# Patient Record
Sex: Female | Born: 1970 | Race: White | Hispanic: No | Marital: Married | Smoking: Never smoker
Health system: Southern US, Community
[De-identification: ages and names within clinical notes are randomized; demographics above are authoritative.]

---

## 2002-09-16 ENCOUNTER — Other Ambulatory Visit: Admission: RE | Admit: 2002-09-16 | Discharge: 2002-09-16 | Payer: Self-pay | Admitting: Obstetrics and Gynecology

## 2003-01-28 ENCOUNTER — Other Ambulatory Visit: Admission: RE | Admit: 2003-01-28 | Discharge: 2003-01-28 | Payer: Self-pay | Admitting: Gynecology

## 2007-03-07 ENCOUNTER — Emergency Department: Payer: Self-pay | Admitting: Emergency Medicine

## 2007-07-19 ENCOUNTER — Ambulatory Visit: Payer: Self-pay | Admitting: Internal Medicine

## 2007-07-27 ENCOUNTER — Emergency Department: Payer: Self-pay | Admitting: Emergency Medicine

## 2010-09-15 ENCOUNTER — Ambulatory Visit: Payer: Self-pay | Admitting: Internal Medicine

## 2011-12-18 IMAGING — CT CT ABD-PELV W/ CM
1 of 2 series · 14 of 32 positions shown, 18 images · non-contrast
Comparison: none

REASON FOR EXAM: ADD ON  CR  [id number]  LLQ pain  eval diverticulitis
elevated white blood count
COMMENTS:

[Series 2: abdomen · axial · 0.98mm/px · z∈[-538,-108]mm · 14 of 96 slices shown, 18 images]
[im 5/96  soft-tissue]
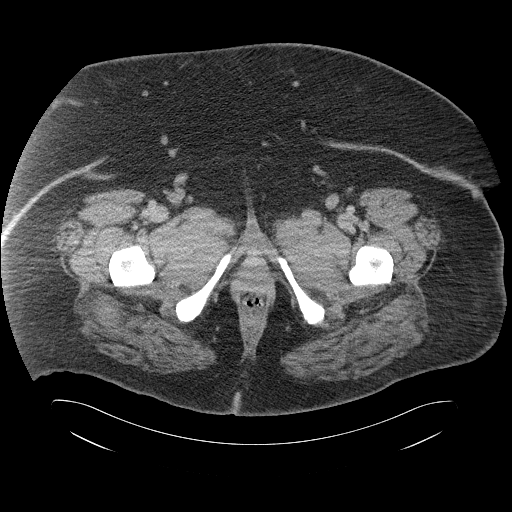
[im 5/96  bone]
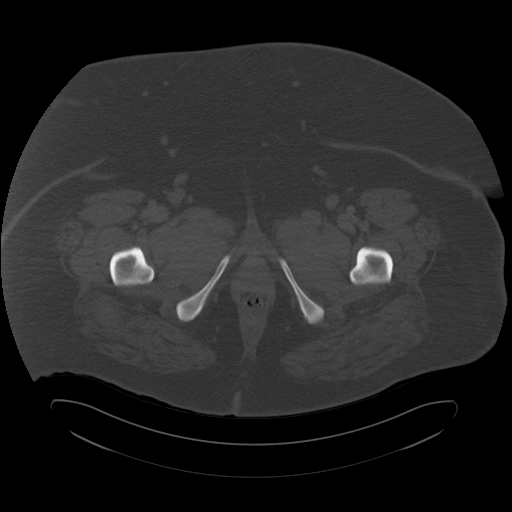
[im 13/96  soft-tissue]
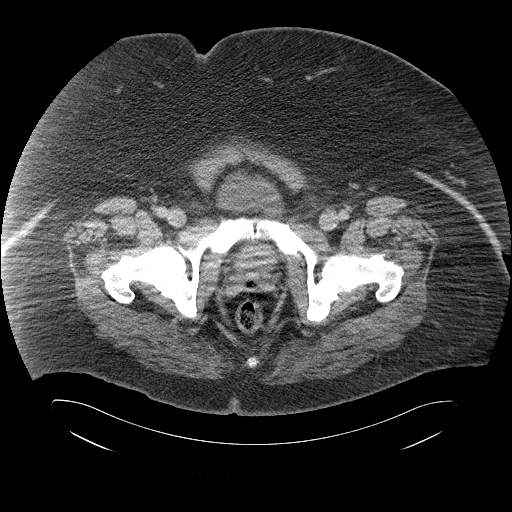
[im 21/96  soft-tissue]
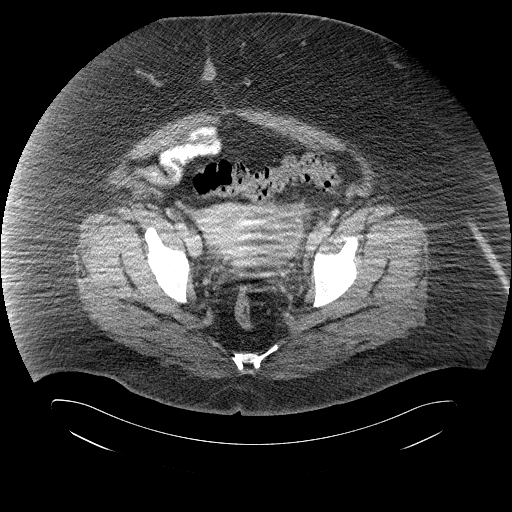
[im 29/96  soft-tissue]
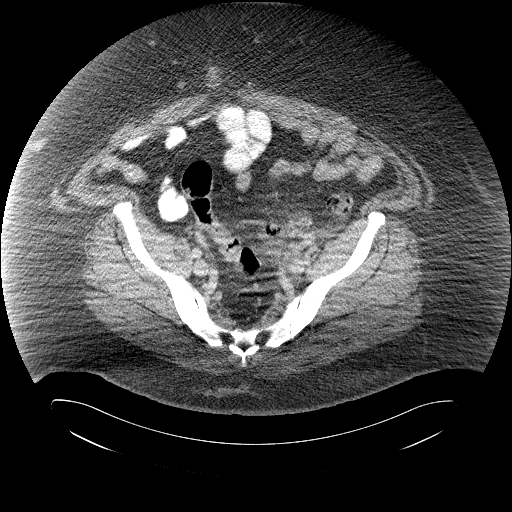
[im 38/96  soft-tissue]
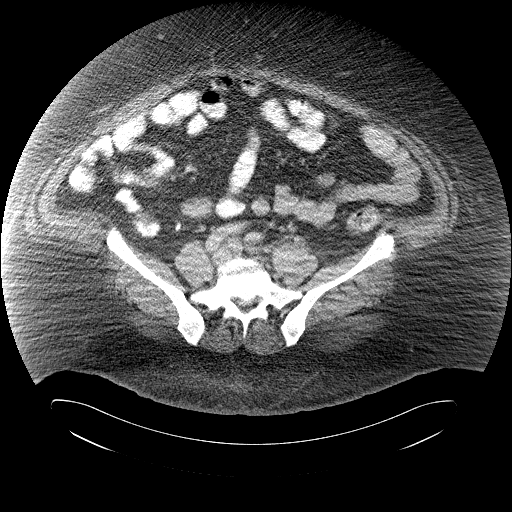
[im 46/96  soft-tissue]
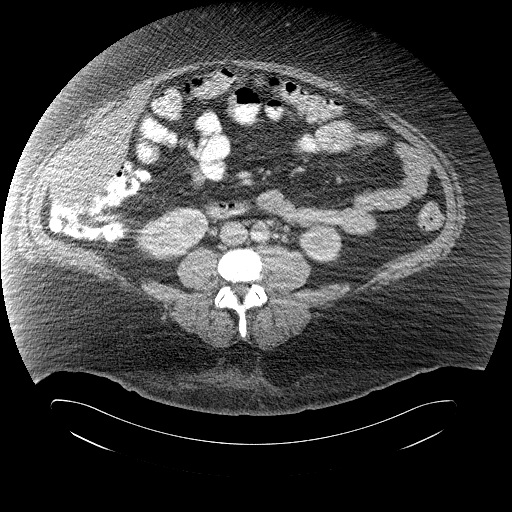
[im 50/96  soft-tissue]
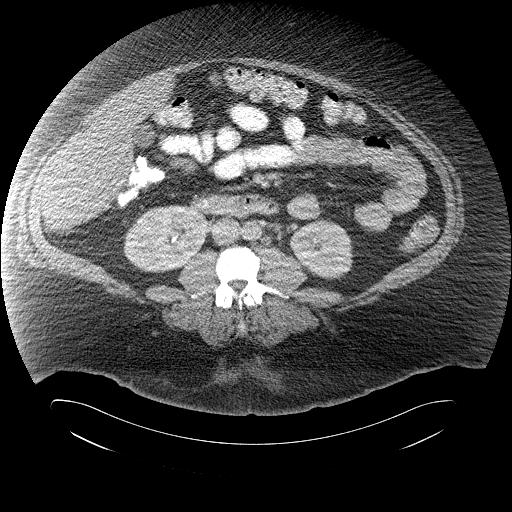
[im 58/96  soft-tissue]
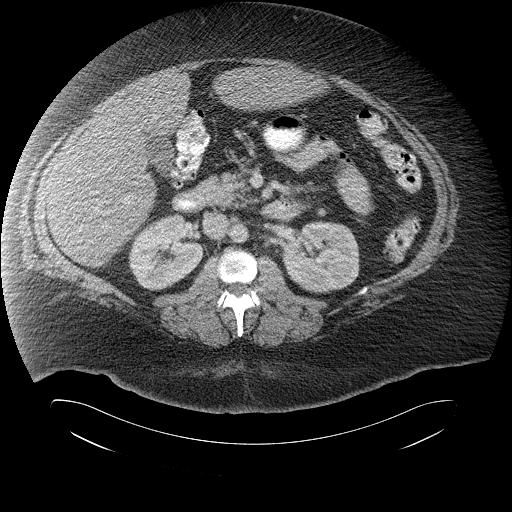
[im 67/96  soft-tissue]
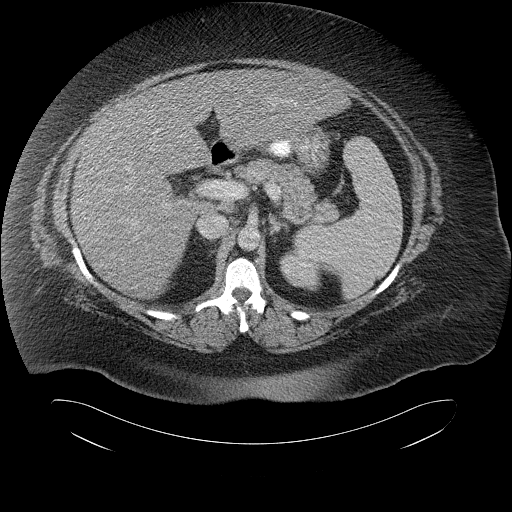
[im 67/96  bone]
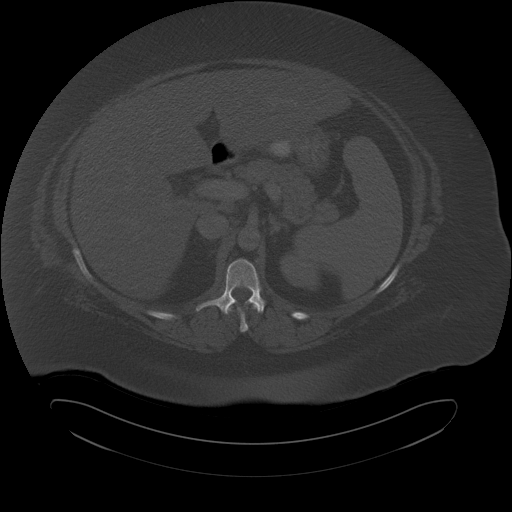
[im 75/96  soft-tissue]
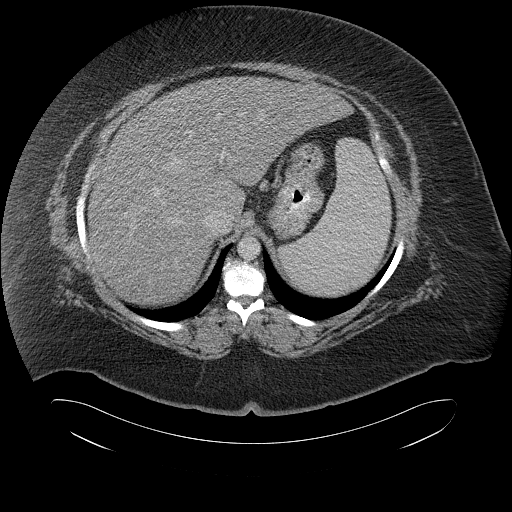
[im 79/96  lung]
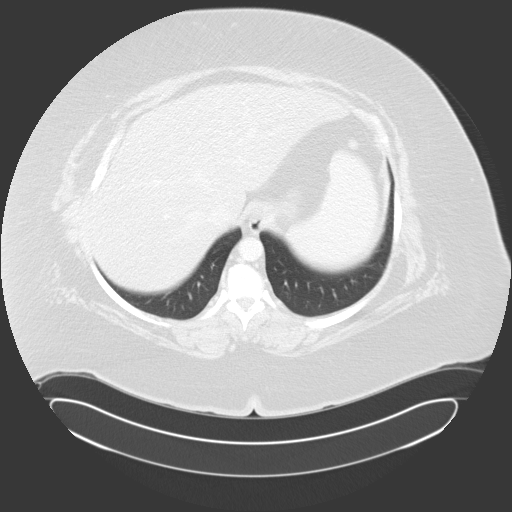
[im 83/96  soft-tissue]
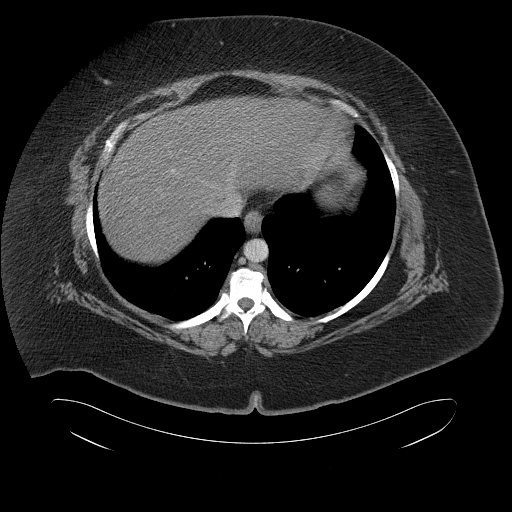
[im 83/96  lung]
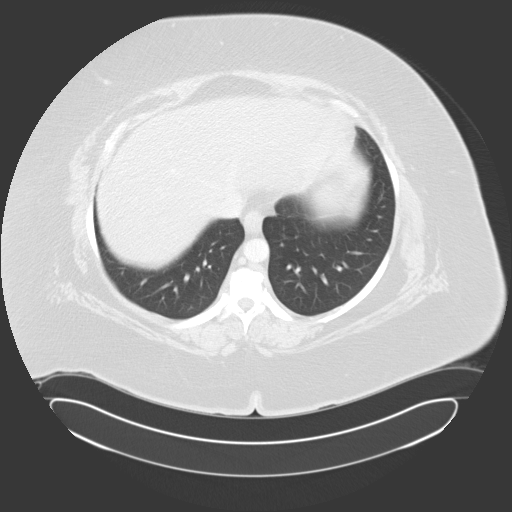
[im 87/96  lung]
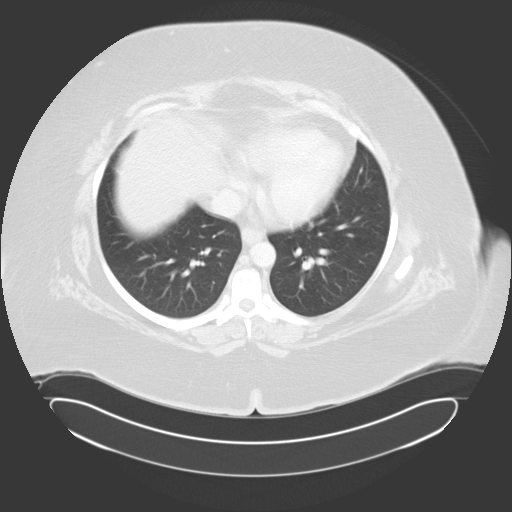
[im 91/96  soft-tissue]
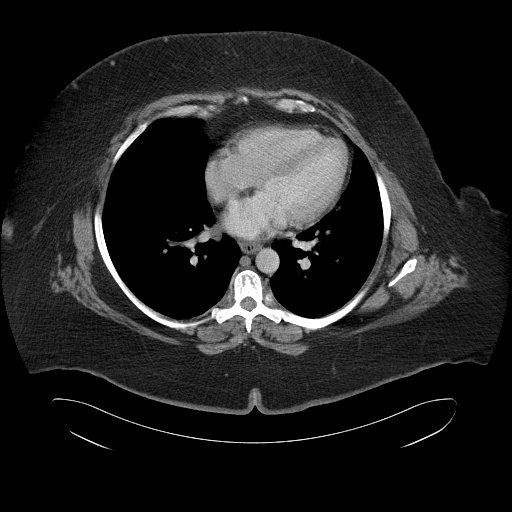
[im 91/96  lung]
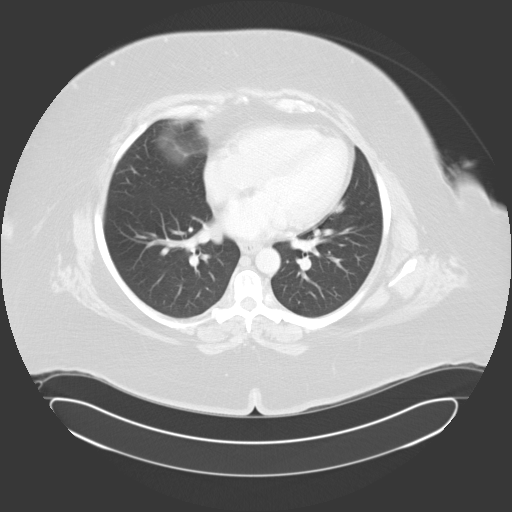

[14 of 32 positions shown; findings below may reference images not displayed]

PROCEDURE:     CT  - CT ABDOMEN / PELVIS  W  - September 15, 2010  [DATE]

RESULT:     Axial CT scanning was performed through the abdomen and pelvis
following intravenous administration of 100 cc of 0sovue-IGI. The patient
also received oral contrast material. Review of multiplanar reconstructed
images was performed separately on the VIA monitor.

The liver exhibits normal density with no focal mass or ductal dilation. The
gallbladder is adequately distended with no evidence of calcified stones.
The spleen, nondistended stomach, and pancreas exhibit no acute abnormality.
The images are degraded as one moves inferiorly from the mid abdomen due to
contact of the patient's patient's flanks with the scanner gantry. The
kidneys exhibit no evidence of obstruction. There is a nonobstructing
approximately 4 mm diameter stone in the lower pole of the right kidney. The
adrenal glands exhibit no parenchymal correction exhibit no masses. The
caliber of the abdominal aorta is normal.

The partially contrast-filled loops of small and large bowel exhibit no
evidence of obstruction. There is sigmoid diverticulosis. There is
incomplete distention of the sigmoid colon and I cannot exclude
diverticulitis. There is a structure which may reflect a diverticular
abscess which lies to the left of the uterus on image 74. This measures 7 x
5.5 cm. I do not see free extraluminal gas collections nor definite free
pelvic fluid. The urinary bladder and uterus are grossly normal within the
limitation of the study. The lung bases are clear. The lumbar vertebral
bodies are preserved in height.
IMPRESSION: 1. The findings are consistent with the clinically suspected sigmoid
diverticulitis. I cannot exclude a diverticular abscess to the left of the
uterus but this area is difficult to evaluate due to artifact from the
contact of the patient's flanks with the CT gantry.
2. I see no acute hepatobiliary abnormality.
3. There is a nonobstructing stone in the lower pole of the right kidney.

This report was called to Dr. Locklear at the conclusion of the study.

## 2021-11-11 ENCOUNTER — Encounter: Payer: Self-pay | Admitting: Emergency Medicine

## 2021-11-11 ENCOUNTER — Ambulatory Visit
Admission: EM | Admit: 2021-11-11 | Discharge: 2021-11-11 | Disposition: A | Discharge status: Home / Self Care | Payer: Self-pay

## 2021-11-11 ENCOUNTER — Other Ambulatory Visit: Payer: Self-pay

## 2021-11-11 DIAGNOSIS — I10 Essential (primary) hypertension: Secondary | ICD-10-CM

## 2021-11-11 DIAGNOSIS — E119 Type 2 diabetes mellitus without complications: Secondary | ICD-10-CM

## 2021-11-11 DIAGNOSIS — G629 Polyneuropathy, unspecified: Secondary | ICD-10-CM

## 2021-11-11 DIAGNOSIS — L03116 Cellulitis of left lower limb: Secondary | ICD-10-CM | POA: Insufficient documentation

## 2021-11-11 DIAGNOSIS — E282 Polycystic ovarian syndrome: Secondary | ICD-10-CM

## 2021-11-11 LAB — CBC WITH DIFFERENTIAL/PLATELET
Abs Immature Granulocytes: 0.16 10*3/uL — ABNORMAL HIGH (ref 0.00–0.07)
Basophils Absolute: 0.1 10*3/uL (ref 0.0–0.1)
Basophils Relative: 0 %
Eosinophils Absolute: 0.1 10*3/uL (ref 0.0–0.5)
Eosinophils Relative: 1 %
HCT: 43.5 % (ref 36.0–46.0)
Hemoglobin: 14.6 g/dL (ref 12.0–15.0)
Immature Granulocytes: 1 %
Lymphocytes Relative: 17 %
Lymphs Abs: 3.3 10*3/uL (ref 0.7–4.0)
MCH: 29.3 pg (ref 26.0–34.0)
MCHC: 33.6 g/dL (ref 30.0–36.0)
MCV: 87.2 fL (ref 80.0–100.0)
Monocytes Absolute: 1.7 10*3/uL — ABNORMAL HIGH (ref 0.1–1.0)
Monocytes Relative: 8 %
Neutro Abs: 14.5 10*3/uL — ABNORMAL HIGH (ref 1.7–7.7)
Neutrophils Relative %: 73 %
Platelets: 163 10*3/uL (ref 150–400)
RBC: 4.99 MIL/uL (ref 3.87–5.11)
RDW: 13.2 % (ref 11.5–15.5)
WBC: 19.9 10*3/uL — ABNORMAL HIGH (ref 4.0–10.5)
nRBC: 0 % (ref 0.0–0.2)

## 2021-11-11 MED ORDER — CEFTRIAXONE SODIUM 1 G IJ SOLR
1.0000 g | Freq: Once | INTRAMUSCULAR | Status: AC
  Administered 2021-11-11: 10:00:00 1 g via INTRAMUSCULAR

## 2021-11-11 MED ORDER — DOXYCYCLINE HYCLATE 100 MG PO CAPS: 100.0000 mg | ORAL_CAPSULE | Freq: Two times a day (BID) | ORAL | 0 refills | Status: DC

## 2021-11-11 NOTE — ED Provider Notes (Signed)
[facility]    CSN: [id number] Arrival date & time: 11/11/21  0805      History   Chief Complaint Chief Complaint  Patient presents with   Fever   Rash    Left knee    HPI April Fisher is a 50 y.o. female who presents with hx of having  a fever up to 103 since 4 days ago. This am at 2 am woke up with chills and her temp was 102. Has mild rhinitis today, but denies cough, HA or body aches. Yesterday noticed mild redness below her knee, and this am is worse. Has hx of cellulitis on her legs x 2 in the past. She scraped the back of her L leg this past weekend. Denies pain on the redness on the anterior lower leg, but where she scraped herself is a little tender. Denies hx of MRSA or staph infections.    Past Medical History:  Diagnosis Date   Diabetes (HCC)    Hypertension    Neuropathy    PCOS (polycystic ovarian syndrome)     Patient Active Problem List   Diagnosis Date Noted   Diabetes (HCC) 11/11/2021   Hypertension 11/11/2021   Neuropathy 11/11/2021   PCOS (polycystic ovarian syndrome) 11/11/2021    Past Surgical History:  Procedure Laterality Date   HERNIA REPAIR      OB History   No obstetric history on file.      Home Medications    Prior to Admission medications   Medication Sig Start Date End Date Taking? Authorizing Provider  doxycycline (VIBRAMYCIN) 100 MG capsule Take 1 capsule (100 mg total) by mouth 2 (two) times daily. 11/11/21  Yes Rodriguez-Southworth, Nettie Elm, PA-C  gabapentin (NEURONTIN) 300 MG capsule Take by mouth. 01/02/20  Yes [provider]  insulin regular human CONCENTRATED (HUMULIN R) 500 UNIT/ML injection Inject 30 unit markings using a U-100 insulin syringe AC breakfast and supper 01/02/20  Yes [provider]  metFORMIN (GLUCOPHAGE-XR) 500 MG 24 hr tablet Take by mouth. 01/02/20  Yes [provider]  BENICAR 40 MG tablet Take 40 mg by mouth daily. 10/04/21   [provider]  OZEMPIC,  0.25 OR 0.5 MG/DOSE, 2 MG/1.5ML SOPN Inject into the skin. 11/02/21   [provider]    Family History History reviewed. No pertinent family history.  Social History Social History   Tobacco Use   Smoking status: Never   Smokeless tobacco: Never  Vaping Use   Vaping Use: Never used  Substance Use Topics   Alcohol use: Yes    Comment: occ   Drug use: Not Currently     Allergies   Sulfa antibiotics   Review of Systems Review of Systems  Constitutional:  Positive for chills and fever.  HENT:  Positive for rhinorrhea. Negative for congestion, ear discharge, ear pain, sore throat and trouble swallowing.   Respiratory:  Negative for cough, chest tightness and shortness of breath.   Musculoskeletal:  Negative for myalgias.  Skin:  Positive for color change, rash and wound.  Neurological:  Negative for headaches.  Hematological:  Negative for adenopathy.    Physical Exam Triage Vital Signs ED Triage Vitals  Enc Vitals Group     BP 11/11/21 0831 113/79     Pulse Rate 11/11/21 0831 (!) 104     Resp 11/11/21 0831 20     Temp 11/11/21 0831 99.7 F (37.6 C)     Temp Source 11/11/21 0831 Oral  SpO2 11/11/21 0831 95 %     Weight 11/11/21 0826 (!) 375 lb (170.1 kg)     Height 11/11/21 0826 5\' 4"  (1.626 m)     Head Circumference --      Peak Flow --      Pain Score 11/11/21 0826 0     Pain Loc --      Pain Edu? --      Excl. in [place]? --    No data found.  Updated Vital Signs BP 113/79 (BP Location: Left Leg)   Pulse (!) 104   Temp 99.7 F (37.6 C) (Oral)   Resp 20   Ht 5\' 4"  (1.626 m)   Wt (!) 375 lb (170.1 kg)   LMP  (LMP Unknown)   SpO2 95%   BMI 64.37 kg/m   Visual Acuity Right Eye Distance:   Left Eye Distance:   Bilateral Distance:    Right Eye Near:   Left Eye Near:    Bilateral Near:     Physical Exam Vitals and nursing note reviewed.  Constitutional:      General: She is not in acute distress.    Appearance: She is obese. She is not  toxic-appearing.  HENT:     Head: Normocephalic.     Right Ear: Tympanic membrane, ear canal and external ear normal.     Left Ear: Tympanic membrane, ear canal and external ear normal.     Nose: Nose normal.     Mouth/Throat:     Mouth: Mucous membranes are moist.     Pharynx: Oropharynx is clear.  Eyes:     General: No scleral icterus.    Conjunctiva/sclera: Conjunctivae normal.  Cardiovascular:     Rate and Rhythm: Normal rate and regular rhythm.     Heart sounds: No murmur heard. Pulmonary:     Effort: Pulmonary effort is normal.     Breath sounds: Normal breath sounds.  Musculoskeletal:        General: Normal range of motion.     Cervical back: Neck supple. No rigidity.     Comments: L LOWER LEG- with 2 cm x 3 cm scab on the back on her lower leg,above the achillis tendon and surrounding erythema and warmth which extends to anterior lower leg and below knee. Feel does not show any open wounds or erythema. There are no streaking going to her inner leg or groin.   Lymphadenopathy:     Cervical: No cervical adenopathy.  Skin:    General: Skin is warm and dry.     Findings: Erythema present. No bruising.  Neurological:     Mental Status: She is alert and oriented to person, place, and time.     Gait: Gait normal.  Psychiatric:        Mood and Affect: Mood normal.        Behavior: Behavior normal.        Thought Content: Thought content normal.        Judgment: Judgment normal.     UC Treatments / Results  Labs (all labs ordered are listed, but only abnormal results are displayed) Labs Reviewed  CBC WITH DIFFERENTIAL/PLATELET - Abnormal; Notable for the following components:      Result Value   WBC 19.9 (*)    Neutro Abs 14.5 (*)    Monocytes Absolute 1.7 (*)    Abs Immature Granulocytes 0.16 (*)    All other components within normal limits    EKG  Radiology No results found.  Procedures Procedures (including critical care time)  Medications Ordered in  UC Medications  cefTRIAXone (ROCEPHIN) injection 1 g (1 g Intramuscular Given 11/11/21 1021)    Initial Impression / Assessment and Plan / UC Course  I have reviewed the triage vital signs and the nursing notes. Pertinent labs results that were available during my care of the patient were reviewed by me and considered in my medical decision making (see chart for details). Cellulitis L lower leg with elevated white cell count on CBC I consulted this case with Dr Leonides Grills and he agreed trial of treatment as out patient. She was given Rocephin 1 G IM and I sent Doxy to her pharmacy as noted. I marked the red areas and explained that if the redness goes past the markings in the next 24 hours, she needs to go to ER for IV antibiotics. She agreed.   Final Clinical Impressions(s) / UC Diagnoses   Final diagnoses:  Cellulitis of left anterior lower leg     Discharge Instructions      Go to the ER if the redness extends beyond the markings. Start the oral antibiotic today.      ED Prescriptions     Medication Sig Dispense Auth. Provider   doxycycline (VIBRAMYCIN) 100 MG capsule Take 1 capsule (100 mg total) by mouth 2 (two) times daily. 20 capsule Rodriguez-Southworth, Nettie Elm, PA-C      PDMP not reviewed this encounter.   Garey Ham, PA-C 11/11/21 1029

## 2021-11-11 NOTE — ED Triage Notes (Signed)
Pt presents today with redness/rash to left knee with fever that began on Monday. Denies pain to left knee.

## 2021-11-11 NOTE — Discharge Instructions (Signed)
Go to the ER if the redness extends beyond the markings. Start the oral antibiotic today.

## 2023-03-14 ENCOUNTER — Ambulatory Visit
Admission: EM | Admit: 2023-03-14 | Discharge: 2023-03-14 | Disposition: A | Discharge status: Home / Self Care | Payer: Self-pay

## 2023-03-14 DIAGNOSIS — L03116 Cellulitis of left lower limb: Secondary | ICD-10-CM

## 2023-03-14 MED ORDER — DOXYCYCLINE HYCLATE 100 MG PO CAPS: 100.0000 mg | ORAL_CAPSULE | Freq: Two times a day (BID) | ORAL | 0 refills | Status: DC

## 2023-03-14 MED ORDER — CEFTRIAXONE SODIUM 1 G IJ SOLR
1.0000 g | Freq: Once | INTRAMUSCULAR | Status: AC
  Administered 2023-03-14: 1 g via INTRAMUSCULAR

## 2023-03-14 NOTE — Discharge Instructions (Signed)
We are covering you for infection in your lower leg.  Please take doxycycline 100 mg twice daily for 10 days.  Stay out of the sun while on this medication as it can cause you to have a sunburn.  We gave you an injection of ceftriaxone to help with your symptoms today.  Keep your leg elevated and ensure you are washing it with soap and water.  If you have any changing or concerning symptoms for a blood clot you need to go to the emergency room including increasing swelling, redness, pain or associated chest pain, shortness of breath, heart racing you need to be seen immediately.  If your symptoms are proving quickly or if you develop any fever, nausea, vomiting you should be seen again.  Monitor the area of redness and if this spreads rapidly you should be reevaluated.  Follow-up with your primary care next week.

## 2023-03-14 NOTE — ED Provider Notes (Signed)
[facility]    CSN: [id number] Arrival date & time: 03/14/23  1721      History   Chief Complaint No chief complaint on file.   HPI April Fisher is a 52 y.o. female.   Patient presents today with a 1 day history of spreading erythema in her left lower leg.  She reports she has had chills but has not had fever that she has been taking NSAIDs on a regular basis due to back pain related to recurrent kidney stones.  She is unsure if this could be keeping her fever down unintentionally.  She denies any nausea, vomiting, chest pain, shortness of breath, heart racing.  She does have a history of recurrent cellulitis in her lower extremities with similar presentation.  She was last treated approximately 1.5 years ago with doxycycline.  She denies any recent antibiotics in the past 90 days but reports that she generally does not do well with penicillin-based medications as they are not effective.  She denies diagnosis of MRSA.  She does have a history of diabetes but reports her blood sugars are adequately controlled.  She denies any risk factors for VTE event including recent hospitalization, immobilization, COVID, exogenous hormone use, active malignancy.  She is anxious to improve as she is scheduled to go out of town later this week.    Past Medical History:  Diagnosis Date   Diabetes ([place])    Hypertension    Neuropathy    PCOS (polycystic ovarian syndrome)     Patient Active Problem List   Diagnosis Date Noted   Diabetes ([place]) 11/11/2021   Hypertension 11/11/2021   Neuropathy 11/11/2021   PCOS (polycystic ovarian syndrome) 11/11/2021    Past Surgical History:  Procedure Laterality Date   HERNIA REPAIR      OB History   No obstetric history on file.      Home Medications    Prior to Admission medications   Medication Sig Start Date End Date Taking? Authorizing Provider  BENICAR 40 MG tablet Take 40 mg by mouth daily. 10/04/21  Yes [provider]   gabapentin (NEURONTIN) 300 MG capsule Take by mouth. 01/02/20  Yes [provider]  insulin regular human CONCENTRATED (HUMULIN R) 500 UNIT/ML injection Inject 30 unit markings using a U-100 insulin syringe AC breakfast and supper 01/02/20  Yes [provider]  metFORMIN (GLUCOPHAGE-XR) 500 MG 24 hr tablet Take by mouth. 01/02/20  Yes [provider]  VICTOZA 18 MG/3ML SOPN Inject into the skin. 02/20/23  Yes [provider]  doxycycline (VIBRAMYCIN) 100 MG capsule Take 1 capsule (100 mg total) by mouth 2 (two) times daily. 03/14/23   Dillon Mcreynolds, Derry Skill, PA-C    Family History History reviewed. No pertinent family history.  Social History Social History   Tobacco Use   Smoking status: Never   Smokeless tobacco: Never  Vaping Use   Vaping Use: Never used  Substance Use Topics   Alcohol use: Yes    Comment: occ   Drug use: Not Currently     Allergies   Sulfa antibiotics   Review of Systems Review of Systems  Constitutional:  Positive for activity change and chills. Negative for appetite change, fatigue and fever.  Respiratory:  Negative for cough and shortness of breath.   Cardiovascular:  Positive for leg swelling. Negative for chest pain and palpitations.  Gastrointestinal:  Negative for abdominal pain, diarrhea, nausea and vomiting.  Musculoskeletal:  Negative for arthralgias and myalgias.  Skin:  Positive for color change and rash. Negative for wound.  Neurological:  Negative for dizziness, light-headedness and headaches.     Physical Exam Triage Vital Signs ED Triage Vitals  Enc Vitals Group     BP 03/14/23 1826 (S) (!) 149/100     Pulse Rate 03/14/23 1826 (!) 106     Resp --      Temp 03/14/23 1826 98.4 F (36.9 C)     Temp Source 03/14/23 1826 Oral     SpO2 03/14/23 1826 97 %     Weight --      Height --      Head Circumference --      Peak Flow --      Pain Score 03/14/23 1824 0     Pain Loc --      Pain Edu? --      Excl. in  [place]? --    No data found.  Updated Vital Signs BP (S) (!) 149/100 (BP Location: Right Wrist)   Pulse (!) 106   Temp 98.4 F (36.9 C) (Oral)   SpO2 97%   Visual Acuity Right Eye Distance:   Left Eye Distance:   Bilateral Distance:    Right Eye Near:   Left Eye Near:    Bilateral Near:     Physical Exam Vitals reviewed.  Constitutional:      General: She is awake. She is not in acute distress.    Appearance: Normal appearance. She is well-developed. She is not ill-appearing.     Comments: Very pleasant female appears stated age in no acute distress sitting comfortably in exam room  HENT:     Head: Normocephalic and atraumatic.  Cardiovascular:     Rate and Rhythm: Normal rate and regular rhythm.     Heart sounds: Normal heart sounds, S1 normal and S2 normal. No murmur heard.    Comments: Negative Bevelyn Buckles' sign on left Pulmonary:     Effort: Pulmonary effort is normal.     Breath sounds: Normal breath sounds. No wheezing, rhonchi or rales.     Comments: Clear to auscultation bilaterally Abdominal:     General: Bowel sounds are normal.     Palpations: Abdomen is soft.     Tenderness: There is no abdominal tenderness.  Musculoskeletal:     Left lower leg: No tenderness or bony tenderness.  Skin:    Findings: Erythema present.          Comments: Erythema noted left lower lateral leg without obvious wound.  No streaking or evidence of lymphangitis.  No bleeding or drainage noted.  Psychiatric:        Behavior: Behavior is cooperative.      UC Treatments / Results  Labs (all labs ordered are listed, but only abnormal results are displayed) Labs Reviewed - No data to display  EKG   Radiology No results found.  Procedures Procedures (including critical care time)  Medications Ordered in UC Medications  cefTRIAXone (ROCEPHIN) injection 1 g (has no administration in time range)    Initial Impression / Assessment and Plan / UC Course  I have reviewed the  triage vital signs and the nursing notes.  Pertinent labs & imaging results that were available during my care of the patient were reviewed by me and considered in my medical decision making (see chart for details).     Patient was mildly tachycardic but otherwise well-appearing and afebrile.  Review of chart indicates that she is generally mildly tachycardic and this  is her baseline.  We did discuss that given her tachycardia and leg swelling it is appropriate to investigate for DVT, however, patient is confident that her symptoms are related to cellulitis that she has had multiple episodes with similar presentation in this leg before.  Ultrasound was deferred for the time being but we discussed that if her symptoms not improving with antibiotics or if she has any worsening symptoms she needs to return immediately to which she expressed understanding.  Will cover with Rocephin 1 g in clinic and doxycycline 100 mg twice daily for 10 days.  Discussed that doxycycline can make her sensitive to the sun and she should avoid prolonged sun exposure while on this medication.  She is to keep area clean with soap and water and monitor area of erythema.  If area of erythema spreads after being on antibiotics she is to return for reevaluation.  Discussed that if she has any worsening symptoms including nausea, vomiting, fever she should be seen immediately.  Strict return precautions given.  Final Clinical Impressions(s) / UC Diagnoses   Final diagnoses:  Cellulitis of leg, left     Discharge Instructions      We are covering you for infection in your lower leg.  Please take doxycycline 100 mg twice daily for 10 days.  Stay out of the sun while on this medication as it can cause you to have a sunburn.  We gave you an injection of ceftriaxone to help with your symptoms today.  Keep your leg elevated and ensure you are washing it with soap and water.  If you have any changing or concerning symptoms for a blood  clot you need to go to the emergency room including increasing swelling, redness, pain or associated chest pain, shortness of breath, heart racing you need to be seen immediately.  If your symptoms are proving quickly or if you develop any fever, nausea, vomiting you should be seen again.  Monitor the area of redness and if this spreads rapidly you should be reevaluated.  Follow-up with your primary care next week.     ED Prescriptions     Medication Sig Dispense Auth. Provider   doxycycline (VIBRAMYCIN) 100 MG capsule Take 1 capsule (100 mg total) by mouth 2 (two) times daily. 20 capsule Navjot Pilgrim, Derry Skill, PA-C      PDMP not reviewed this encounter.   Terrilee Croak, PA-C 03/14/23 1844

## 2023-03-14 NOTE — ED Triage Notes (Signed)
Pt here for swelling LT lower extremity, redness. Pt states she have some chills last night, pt has been taking ibuprofen for some back pain so unusre if she's been running a fever. Pt reports she has had cellulitis in the same leg before.

## 2024-01-07 ENCOUNTER — Ambulatory Visit
Admission: RE | Admit: 2024-01-07 | Discharge: 2024-01-07 | Disposition: A | Discharge status: Home / Self Care | Payer: Self-pay | Source: Ambulatory Visit | Attending: Family Medicine | Admitting: Family Medicine

## 2024-01-07 VITALS — BP 159/86 | HR 107 | Temp 98.0°F | Resp 15 | Ht 64.0 in | Wt 375.0 lb

## 2024-01-07 DIAGNOSIS — L03116 Cellulitis of left lower limb: Secondary | ICD-10-CM

## 2024-01-07 MED ORDER — DOXYCYCLINE HYCLATE 100 MG PO CAPS: 100.0000 mg | ORAL_CAPSULE | Freq: Two times a day (BID) | ORAL | 0 refills | Status: DC

## 2024-01-07 MED ORDER — FLUCONAZOLE 150 MG PO TABS
150.0000 mg | ORAL_TABLET | Freq: Once | ORAL | 0 refills | Status: AC
Start: 2024-01-07 — End: 2024-01-07

## 2024-01-07 NOTE — ED Provider Notes (Signed)
 [facility]    CSN: [id number] Arrival date & time: 01/07/24  0843      History   Chief Complaint Chief Complaint  Patient presents with   Cellulitis    Appointment    HPI April Fisher is a 53 y.o. female.   HPI  April Fisher presents for fever, left lower leg redness with mild pain that started yesterday.  Denies any injury or trauma.  Reports having frequent bouts of cellulitis.  At one time she was admitted to the hospital for a week.  Tmax 101 F.   Past Medical History:  Diagnosis Date   Diabetes (HCC)    Hypertension    Neuropathy    PCOS (polycystic ovarian syndrome)     Patient Active Problem List   Diagnosis Date Noted   Diabetes (HCC) 11/11/2021   Hypertension 11/11/2021   Neuropathy 11/11/2021   PCOS (polycystic ovarian syndrome) 11/11/2021    Past Surgical History:  Procedure Laterality Date   HERNIA REPAIR      OB History   No obstetric history on file.      Home Medications    Prior to Admission medications   Medication Sig Start Date End Date Taking? Authorizing Provider  fluconazole  (DIFLUCAN ) 150 MG tablet Take 1 tablet (150 mg total) by mouth once for 1 dose. 01/07/24 01/07/24 Yes Neera Teng, DO  BENICAR 40 MG tablet Take 40 mg by mouth daily. 10/04/21   [provider]  doxycycline  (VIBRAMYCIN ) 100 MG capsule Take 1 capsule (100 mg total) by mouth 2 (two) times daily. 01/07/24   Nature Kueker, DO  gabapentin (NEURONTIN) 300 MG capsule Take by mouth. 01/02/20   [provider]  insulin regular human CONCENTRATED (HUMULIN R) 500 UNIT/ML injection Inject 30 unit markings using a U-100 insulin syringe AC breakfast and supper 01/02/20   [provider]  metFORMIN (GLUCOPHAGE-XR) 500 MG 24 hr tablet Take by mouth. 01/02/20   [provider]  VICTOZA 18 MG/3ML SOPN Inject into the skin. 02/20/23   [provider]    Family History History reviewed. No pertinent family history.  Social  History Social History   Tobacco Use   Smoking status: Never   Smokeless tobacco: Never  Vaping Use   Vaping status: Never Used  Substance Use Topics   Alcohol use: Yes    Comment: occ   Drug use: Not Currently     Allergies   Sulfa antibiotics   Review of Systems Review of Systems :negative unless otherwise stated in HPI.      Physical Exam Triage Vital Signs ED Triage Vitals  Encounter Vitals Group     BP      Systolic BP Percentile      Diastolic BP Percentile      Pulse      Resp      Temp      Temp src      SpO2      Weight      Height      Head Circumference      Peak Flow      Pain Score      Pain Loc      Pain Education      Exclude from Growth Chart    No data found.  Updated Vital Signs BP (!) 159/86 (BP Location: Left Arm)   Pulse (!) 107   Temp 98 F (36.7 C) (Oral)   Resp 15   Ht 5' 4 (1.626  m)   Wt (!) 170.1 kg   LMP  (LMP Unknown)   SpO2 97%   BMI 64.37 kg/m   Visual Acuity Right Eye Distance:   Left Eye Distance:   Bilateral Distance:    Right Eye Near:   Left Eye Near:    Bilateral Near:     Physical Exam  GEN: alert, well appearing female, in no acute distress  CV:  tachycardic rate, cap refill RESP: no increased work of breathing SKIN: warm and dry; lower anterior shin erythema with warmth streaking up to lateral left lower leg     UC Treatments / Results  Labs (all labs ordered are listed, but only abnormal results are displayed) Labs Reviewed - No data to display  EKG   Radiology No results found.  Procedures Procedures (including critical care time)  Medications Ordered in UC Medications - No data to display  Initial Impression / Assessment and Plan / UC Course  I have reviewed the triage vital signs and the nursing notes.  Pertinent labs & imaging results that were available during my care of the patient were reviewed by me and considered in my medical decision making (see chart for  details).     Patient is a 53 y.o. femalewho presents for left lower leg rash.  She has history of recurrent cellulitis based off chart review.  Overall, patient is well-appearing and well-hydrated.  She is tachycardic and hypertensive.  April Fisher is afebrile.  Exam concerning for cellulitis of the left lower leg.  Treat with doxycycline  as this has cured her in the past.  She has history of antibiotic associated yeast infections therefore Diflucan  was coprescribed as well.  Patient declined working of the leg wound.  States she will monitor it on her own as she is used to this.   Reviewed expectations regarding course of current medical issues.  All questions asked were answered.  Outlined signs and symptoms indicating need for more acute intervention. Patient verbalized understanding. After Visit Summary given.   Final Clinical Impressions(s) / UC Diagnoses   Final diagnoses:  Cellulitis of leg, left     Discharge Instructions      Stop by the pharmacy to pick up your prescriptions.  Continue to monitor leg for spreading infection.      ED Prescriptions     Medication Sig Dispense Auth. Provider   doxycycline  (VIBRAMYCIN ) 100 MG capsule Take 1 capsule (100 mg total) by mouth 2 (two) times daily. 20 capsule Ersel Enslin, DO   fluconazole  (DIFLUCAN ) 150 MG tablet Take 1 tablet (150 mg total) by mouth once for 1 dose. 1 tablet Kriste Berth, DO      PDMP not reviewed this encounter.              Kriste Berth, DO 01/07/24 0940

## 2024-01-07 NOTE — Discharge Instructions (Addendum)
 Stop by the pharmacy to pick up your prescriptions.  Continue to monitor leg for spreading infection.

## 2024-01-07 NOTE — ED Triage Notes (Signed)
 Patient reports redness and swelling in her left lower leg that started yesterday.  Patient reports fever 99.  Patient states that she gets cellulitis frequently.

## 2024-01-29 ENCOUNTER — Ambulatory Visit
Admission: EM | Admit: 2024-01-29 | Discharge: 2024-01-29 | Disposition: A | Discharge status: Home / Self Care | Payer: Self-pay | Attending: Emergency Medicine | Admitting: Emergency Medicine

## 2024-01-29 DIAGNOSIS — L03116 Cellulitis of left lower limb: Secondary | ICD-10-CM

## 2024-01-29 MED ORDER — DOXYCYCLINE HYCLATE 100 MG PO CAPS: 100.0000 mg | ORAL_CAPSULE | Freq: Two times a day (BID) | ORAL | 0 refills | Status: AC

## 2024-01-29 NOTE — ED Triage Notes (Signed)
Patient states that she had chills and fever in the middle of the night. Woke up with right lower leg swollen. Hx of cellulitis. Patient is diabetic.

## 2024-01-29 NOTE — ED Provider Notes (Signed)
[facility]    CSN: [id number] Arrival date & time: 01/29/24  1714      History   Chief Complaint Chief Complaint  Patient presents with   Leg Swelling    HPI April Fisher is a 53 y.o. female.   HPI  53 year old female with past medical history significant for PCOS, neuropathy, hypertension, diabetes presents for evaluation of fever, chills, and redness to her left lower extremity.  She reports that she woke up in the middle the night and had a fever of 102.2.  She took ibuprofen and the fever resolved.  When she woke up this morning her left lower extremity was red and warm to touch.  She denies any injury though she states that she was peeling some of the dead skin off of her lower leg.  Past Medical History:  Diagnosis Date   Diabetes (HCC)    Hypertension    Neuropathy    PCOS (polycystic ovarian syndrome)     Patient Active Problem List   Diagnosis Date Noted   Diabetes (HCC) 11/11/2021   Hypertension 11/11/2021   Neuropathy 11/11/2021   PCOS (polycystic ovarian syndrome) 11/11/2021    Past Surgical History:  Procedure Laterality Date   HERNIA REPAIR      OB History   No obstetric history on file.      Home Medications    Prior to Admission medications   Medication Sig Start Date End Date Taking? Authorizing Provider  BENICAR 40 MG tablet Take 40 mg by mouth daily. 10/04/21  Yes [provider]  gabapentin (NEURONTIN) 300 MG capsule Take by mouth. 01/02/20  Yes [provider]  insulin regular human CONCENTRATED (HUMULIN R) 500 UNIT/ML injection Inject 30 unit markings using a U-100 insulin syringe AC breakfast and supper 01/02/20  Yes [provider]  metFORMIN (GLUCOPHAGE-XR) 500 MG 24 hr tablet Take by mouth. 01/02/20  Yes [provider]  doxycycline (VIBRAMYCIN) 100 MG capsule Take 1 capsule (100 mg total) by mouth 2 (two) times daily. 01/29/24   Becky Augusta, NP  VICTOZA 18 MG/3ML SOPN Inject into the skin.  02/20/23   [provider]    Family History History reviewed. No pertinent family history.  Social History Social History   Tobacco Use   Smoking status: Never   Smokeless tobacco: Never  Vaping Use   Vaping status: Never Used  Substance Use Topics   Alcohol use: Yes    Comment: occ   Drug use: Not Currently     Allergies   Sulfa antibiotics   Review of Systems Review of Systems  Constitutional:  Positive for chills and fever.  Skin:  Positive for color change. Negative for wound.     Physical Exam Triage Vital Signs ED Triage Vitals  Encounter Vitals Group     BP      Systolic BP Percentile      Diastolic BP Percentile      Pulse      Resp      Temp      Temp src      SpO2      Weight      Height      Head Circumference      Peak Flow      Pain Score      Pain Loc      Pain Education      Exclude from Growth Chart    No data found.  Updated Vital Signs BP Marland Kitchen)  150/97 (BP Location: Left Arm)   Pulse (!) 110   Temp 99.3 F (37.4 C)   Resp 19   LMP  (LMP Unknown)   SpO2 97%   Visual Acuity Right Eye Distance:   Left Eye Distance:   Bilateral Distance:    Right Eye Near:   Left Eye Near:    Bilateral Near:     Physical Exam Vitals and nursing note reviewed.  Constitutional:      Appearance: Normal appearance.  Skin:    General: Skin is warm and dry.     Capillary Refill: Capillary refill takes less than 2 seconds.     Findings: Erythema present. No lesion or rash.  Neurological:     General: No focal deficit present.     Mental Status: She is alert and oriented to person, place, and time.      UC Treatments / Results  Labs (all labs ordered are listed, but only abnormal results are displayed) Labs Reviewed - No data to display  EKG   Radiology No results found.  Procedures Procedures (including critical care time)  Medications Ordered in UC Medications - No data to display  Initial Impression / Assessment  and Plan / UC Course  I have reviewed the triage vital signs and the nursing notes.  Pertinent labs & imaging results that were available during my care of the patient were reviewed by me and considered in my medical decision making (see chart for details).   Patient is a pleasant, nontoxic-appearing 53 year old female presenting for evaluation of left lower extremity redness as outlined HPI above.  As you can see the image above, there is a deep erythematous patch in the distal anterolateral aspect of the left lower extremity with some redness extending up the posterior lateral aspect of the middle and proximal left lower extremity.  This erythema is blanchable and it is slightly warmer to touch than the surrounding tissue.  No induration or fluctuance.  DP and PT pulses are 2+.  There is no evidence of injury the patient does have dry skin.  She has had cellulitis in the past and I suspect that she has a return of cellulitis.  She denies any history of MRSA though her husband has had MRSA.  I will discharge her home on doxycycline 100 mg twice daily for 10 days for treatment of cellulitis.  She has been advised to continue to monitor the redness and if she has any increasing redness, her fevers do not respond to Tylenol or ibuprofen, or she develops rigors she is to go to the ER for evaluation.  Final Clinical Impressions(s) / UC Diagnoses   Final diagnoses:  Cellulitis of left lower extremity     Discharge Instructions      Take the doxycycline twice daily with food for 10 days for treatment of cellulitis.  Keep your left lower extremity elevated is much as possible to help decrease swelling and aid in resolution of the infection.  If the redness continues to spread, you develop swelling to her lower extremity, shaking chills, or fevers that do not respond to Tylenol or ibuprofen you need to go to the ER for evaluation.     ED Prescriptions     Medication Sig Dispense Auth. Provider    doxycycline (VIBRAMYCIN) 100 MG capsule Take 1 capsule (100 mg total) by mouth 2 (two) times daily. 20 capsule Becky Augusta, NP      PDMP not reviewed this encounter.   Becky Augusta,  NP 01/29/24 1806

## 2024-01-29 NOTE — Discharge Instructions (Addendum)
Take the doxycycline twice daily with food for 10 days for treatment of cellulitis.  Keep your left lower extremity elevated is much as possible to help decrease swelling and aid in resolution of the infection.  If the redness continues to spread, you develop swelling to her lower extremity, shaking chills, or fevers that do not respond to Tylenol or ibuprofen you need to go to the ER for evaluation.

## 2024-04-08 ENCOUNTER — Ambulatory Visit: Payer: Self-pay
